# Patient Record
Sex: Female | Born: 1975 | Race: White | Hispanic: No | Marital: Single | Smoking: Current every day smoker
Health system: Southern US, Community
[De-identification: ages and names within clinical notes are randomized; demographics above are authoritative.]

---

## 2006-08-14 ENCOUNTER — Ambulatory Visit (HOSPITAL_COMMUNITY): Admission: RE | Admit: 2006-08-14 | Discharge: 2006-08-14 | Payer: Self-pay | Admitting: Obstetrics

## 2006-11-21 ENCOUNTER — Inpatient Hospital Stay (HOSPITAL_COMMUNITY): Admission: AD | Admit: 2006-11-21 | Discharge: 2006-11-22 | Payer: Self-pay | Admitting: Obstetrics & Gynecology

## 2007-01-01 ENCOUNTER — Emergency Department (HOSPITAL_COMMUNITY): Admission: EM | Admit: 2007-01-01 | Discharge: 2007-01-01 | Payer: Self-pay | Admitting: Emergency Medicine

## 2010-10-15 LAB — CBC
HCT: 29.8 — ABNORMAL LOW
Hemoglobin: 10.6 — ABNORMAL LOW
MCV: 90.9
RBC: 3.81 — ABNORMAL LOW
RDW: 13.5
WBC: 12.9 — ABNORMAL HIGH

## 2010-10-15 LAB — RPR: RPR Ser Ql: NONREACTIVE

## 2016-08-13 ENCOUNTER — Emergency Department (HOSPITAL_COMMUNITY): Payer: Self-pay

## 2016-08-13 ENCOUNTER — Encounter (HOSPITAL_COMMUNITY): Payer: Self-pay | Admitting: Emergency Medicine

## 2016-08-13 ENCOUNTER — Emergency Department (HOSPITAL_COMMUNITY)
Admission: EM | Admit: 2016-08-13 | Discharge: 2016-08-14 | Disposition: A | Discharge status: Home / Self Care | Payer: Self-pay | Attending: Emergency Medicine | Admitting: Emergency Medicine

## 2016-08-13 DIAGNOSIS — S6992XA Unspecified injury of left wrist, hand and finger(s), initial encounter: Secondary | ICD-10-CM

## 2016-08-13 DIAGNOSIS — E876 Hypokalemia: Secondary | ICD-10-CM | POA: Insufficient documentation

## 2016-08-13 DIAGNOSIS — Y9383 Activity, rough housing and horseplay: Secondary | ICD-10-CM | POA: Insufficient documentation

## 2016-08-13 DIAGNOSIS — Z23 Encounter for immunization: Secondary | ICD-10-CM | POA: Insufficient documentation

## 2016-08-13 DIAGNOSIS — W503XXA Accidental bite by another person, initial encounter: Secondary | ICD-10-CM

## 2016-08-13 DIAGNOSIS — Y9259 Other trade areas as the place of occurrence of the external cause: Secondary | ICD-10-CM | POA: Insufficient documentation

## 2016-08-13 DIAGNOSIS — R51 Headache: Secondary | ICD-10-CM | POA: Insufficient documentation

## 2016-08-13 DIAGNOSIS — Y999 Unspecified external cause status: Secondary | ICD-10-CM | POA: Insufficient documentation

## 2016-08-13 DIAGNOSIS — S62523B Displaced fracture of distal phalanx of unspecified thumb, initial encounter for open fracture: Secondary | ICD-10-CM | POA: Insufficient documentation

## 2016-08-13 DIAGNOSIS — S0081XA Abrasion of other part of head, initial encounter: Secondary | ICD-10-CM | POA: Insufficient documentation

## 2016-08-13 DIAGNOSIS — F17210 Nicotine dependence, cigarettes, uncomplicated: Secondary | ICD-10-CM | POA: Insufficient documentation

## 2016-08-13 MED ORDER — TETANUS-DIPHTH-ACELL PERTUSSIS 5-2.5-18.5 LF-MCG/0.5 IM SUSP
0.5000 mL | Freq: Once | INTRAMUSCULAR | Status: AC
  Administered 2016-08-13: 0.5 mL via INTRAMUSCULAR
  Filled 2016-08-13: qty 0.5

## 2016-08-13 MED ORDER — AMOXICILLIN-POT CLAVULANATE 875-125 MG PO TABS
1.0000 | ORAL_TABLET | Freq: Once | ORAL | Status: AC
Start: 2016-08-13 — End: 2016-08-14
  Administered 2016-08-14: 1 via ORAL
  Filled 2016-08-13: qty 1

## 2016-08-13 MED ORDER — LIDOCAINE HCL 1 % IJ SOLN
10.0000 mL | Freq: Once | INTRAMUSCULAR | Status: AC
  Administered 2016-08-13: 10 mL

## 2016-08-13 MED ORDER — LIDOCAINE HCL 1 % IJ SOLN
10.0000 mL | Freq: Once | INTRAMUSCULAR | Status: DC
  Filled 2016-08-13: qty 20

## 2016-08-13 MED ORDER — KETOROLAC TROMETHAMINE 30 MG/ML IJ SOLN
30.0000 mg | Freq: Once | INTRAMUSCULAR | Status: AC
  Administered 2016-08-14: 30 mg via INTRAMUSCULAR
  Filled 2016-08-13: qty 1
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
## 2016-08-13 NOTE — ED Notes (Signed)
Bed: [id number] Expected date:  Expected time:  Means of arrival:  Comments: 41 yo F  Thumb injury neck pain
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
## 2016-08-14 LAB — I-STAT BETA HCG BLOOD, ED (MC, WL, AP ONLY)

## 2016-08-14 LAB — I-STAT CHEM 8, ED
BUN: 11 mg/dL (ref 6–20)
CHLORIDE: 99 mmol/L — AB (ref 101–111)
Calcium, Ion: 1.09 mmol/L — ABNORMAL LOW (ref 1.15–1.40)
Creatinine, Ser: 0.9 mg/dL (ref 0.44–1.00)
GLUCOSE: 118 mg/dL — AB (ref 65–99)
HCT: 33 % — ABNORMAL LOW (ref 36.0–46.0)
Hemoglobin: 11.2 g/dL — ABNORMAL LOW (ref 12.0–15.0)
POTASSIUM: 2.9 mmol/L — AB (ref 3.5–5.1)
SODIUM: 136 mmol/L (ref 135–145)
TCO2: 26 mmol/L (ref 0–100)

## 2016-08-14 MED ORDER — POTASSIUM CHLORIDE CRYS ER 20 MEQ PO TBCR
40.0000 meq | EXTENDED_RELEASE_TABLET | Freq: Once | ORAL | Status: AC
  Administered 2016-08-14: 40 meq via ORAL
  Filled 2016-08-14: qty 2

## 2016-08-14 MED ORDER — AMOXICILLIN-POT CLAVULANATE 875-125 MG PO TABS: 1.0000 | ORAL_TABLET | Freq: Two times a day (BID) | ORAL | 0 refills | Status: AC

## 2016-08-14 MED ORDER — IBUPROFEN 600 MG PO TABS: 600.0000 mg | ORAL_TABLET | Freq: Four times a day (QID) | ORAL | 0 refills | Status: AC | PRN

## 2016-08-14 MED ORDER — SODIUM CHLORIDE 0.9 % IV SOLN
3.0000 g | Freq: Once | INTRAVENOUS | Status: AC
  Administered 2016-08-14: 3 g via INTRAVENOUS
  Filled 2016-08-14: qty 3

## 2016-08-14 NOTE — Discharge Instructions (Signed)
Take Augmentin as prescribed until finished to prevent infection. Call the office of Dr. Izora Ribasoley in the morning to schedule close outpatient follow up. Take ibuprofen for pain. Keep your wound clean and wrapped to prevent infection. Do not soak it in water such as when bathing. Return to the ED for new or concerning symptoms such as worsening pain or signs of infection.

## 2016-08-14 NOTE — ED Notes (Signed)
Patient sleeping soundly

## 2016-08-14 NOTE — ED Notes (Addendum)
Pt. Walked around in the room, unsteady on her feet. Pt. Redirected back to bed because she was stagering around in the room on her feet. PA,Kelly made aware.

## 2016-08-14 NOTE — ED Notes (Signed)
Patient ambulated to the bathroom. Slightly unsteady gait.

## 2017-05-12 ENCOUNTER — Emergency Department (HOSPITAL_COMMUNITY)
Admission: EM | Admit: 2017-05-12 | Discharge: 2017-05-13 | Disposition: A | Discharge status: Home / Self Care | Payer: Self-pay | Attending: Emergency Medicine | Admitting: Emergency Medicine

## 2017-05-12 ENCOUNTER — Encounter (HOSPITAL_COMMUNITY): Payer: Self-pay

## 2017-05-12 DIAGNOSIS — F17210 Nicotine dependence, cigarettes, uncomplicated: Secondary | ICD-10-CM | POA: Insufficient documentation

## 2017-05-12 DIAGNOSIS — S41112A Laceration without foreign body of left upper arm, initial encounter: Secondary | ICD-10-CM | POA: Insufficient documentation

## 2017-05-12 DIAGNOSIS — Y929 Unspecified place or not applicable: Secondary | ICD-10-CM | POA: Insufficient documentation

## 2017-05-12 DIAGNOSIS — W228XXA Striking against or struck by other objects, initial encounter: Secondary | ICD-10-CM | POA: Insufficient documentation

## 2017-05-12 DIAGNOSIS — Y999 Unspecified external cause status: Secondary | ICD-10-CM | POA: Insufficient documentation

## 2017-05-12 DIAGNOSIS — Y9302 Activity, running: Secondary | ICD-10-CM | POA: Insufficient documentation

## 2017-05-12 DIAGNOSIS — Z79899 Other long term (current) drug therapy: Secondary | ICD-10-CM | POA: Insufficient documentation
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
## 2017-05-13 LAB — RAPID HIV SCREEN (HIV 1/2 AB+AG)
HIV 1/2 ANTIBODIES: NONREACTIVE
HIV-1 P24 ANTIGEN - HIV24: NONREACTIVE

## 2017-05-13 MED ORDER — LIDOCAINE-EPINEPHRINE (PF) 2 %-1:200000 IJ SOLN
10.0000 mL | Freq: Once | INTRAMUSCULAR | Status: DC
  Filled 2017-05-13: qty 20

## 2017-05-13 NOTE — Discharge Instructions (Signed)
Return in 12 days for suture removal
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
## 2017-05-14 LAB — HEPATITIS PANEL, ACUTE
HCV Ab: <0.1 s/co ratio (ref 0.0–0.9)
HEP A IGM: NEGATIVE
HEP B C IGM: NEGATIVE
HEP B S AG: NEGATIVE
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
## 2020-03-06 DEATH — deceased
